# Patient Record
Sex: Female | Born: 1965 | Race: Black or African American | Hispanic: No | Marital: Single | Smoking: Never smoker
Health system: Southern US, Community
[De-identification: ages and names within clinical notes are randomized; demographics above are authoritative.]

## PROBLEM LIST (undated history)

## (undated) DIAGNOSIS — J189 Pneumonia, unspecified organism: Secondary | ICD-10-CM

## (undated) DIAGNOSIS — M779 Enthesopathy, unspecified: Secondary | ICD-10-CM

## (undated) DIAGNOSIS — J40 Bronchitis, not specified as acute or chronic: Secondary | ICD-10-CM

## (undated) DIAGNOSIS — J302 Other seasonal allergic rhinitis: Secondary | ICD-10-CM

## (undated) DIAGNOSIS — M1990 Unspecified osteoarthritis, unspecified site: Secondary | ICD-10-CM

## (undated) DIAGNOSIS — D649 Anemia, unspecified: Secondary | ICD-10-CM

## (undated) DIAGNOSIS — R011 Cardiac murmur, unspecified: Secondary | ICD-10-CM

## (undated) DIAGNOSIS — H6093 Unspecified otitis externa, bilateral: Secondary | ICD-10-CM

---

## 1991-06-04 HISTORY — PX: OTHER SURGICAL HISTORY: SHX169

## 2003-12-14 ENCOUNTER — Other Ambulatory Visit: Admission: RE | Admit: 2003-12-14 | Discharge: 2003-12-14 | Payer: Self-pay | Admitting: Family Medicine

## 2003-12-18 ENCOUNTER — Ambulatory Visit (HOSPITAL_COMMUNITY): Admission: RE | Admit: 2003-12-18 | Discharge: 2003-12-18 | Payer: Self-pay | Admitting: Family Medicine

## 2005-06-18 ENCOUNTER — Other Ambulatory Visit: Admission: RE | Admit: 2005-06-18 | Discharge: 2005-06-18 | Payer: Self-pay | Admitting: Family Medicine

## 2005-06-20 ENCOUNTER — Emergency Department (HOSPITAL_COMMUNITY): Admission: EM | Admit: 2005-06-20 | Discharge: 2005-06-20 | Payer: Self-pay | Admitting: Emergency Medicine

## 2008-08-10 ENCOUNTER — Other Ambulatory Visit: Admission: RE | Admit: 2008-08-10 | Discharge: 2008-08-10 | Payer: Self-pay | Admitting: Family Medicine

## 2010-02-08 ENCOUNTER — Emergency Department: Payer: Self-pay | Admitting: Emergency Medicine

## 2010-04-11 ENCOUNTER — Other Ambulatory Visit: Admission: RE | Admit: 2010-04-11 | Discharge: 2010-04-11 | Payer: Self-pay | Admitting: Family Medicine

## 2010-08-17 ENCOUNTER — Other Ambulatory Visit: Payer: Self-pay | Admitting: Family Medicine

## 2010-08-17 DIAGNOSIS — Z1231 Encounter for screening mammogram for malignant neoplasm of breast: Secondary | ICD-10-CM

## 2010-08-29 ENCOUNTER — Ambulatory Visit
Admission: RE | Admit: 2010-08-29 | Discharge: 2010-08-29 | Disposition: A | Discharge status: Home / Self Care | Payer: BC Managed Care – PPO | Source: Ambulatory Visit | Attending: Family Medicine | Admitting: Family Medicine

## 2010-08-29 DIAGNOSIS — Z1231 Encounter for screening mammogram for malignant neoplasm of breast: Secondary | ICD-10-CM

## 2011-11-14 ENCOUNTER — Other Ambulatory Visit: Payer: Self-pay | Admitting: Family Medicine

## 2011-11-14 ENCOUNTER — Ambulatory Visit
Admission: RE | Admit: 2011-11-14 | Discharge: 2011-11-14 | Disposition: A | Discharge status: Home / Self Care | Payer: BC Managed Care – PPO | Source: Ambulatory Visit | Attending: Family Medicine | Admitting: Family Medicine

## 2011-11-14 DIAGNOSIS — R509 Fever, unspecified: Secondary | ICD-10-CM

## 2011-11-14 DIAGNOSIS — R112 Nausea with vomiting, unspecified: Secondary | ICD-10-CM

## 2011-11-14 DIAGNOSIS — R109 Unspecified abdominal pain: Secondary | ICD-10-CM

## 2011-11-14 MED ORDER — IOHEXOL 300 MG/ML  SOLN
100.0000 mL | Freq: Once | INTRAMUSCULAR | Status: AC | PRN
  Administered 2011-11-14: 100 mL via INTRAVENOUS

## 2012-12-06 IMAGING — CT CT ABD-PELV W/ CM
2 of 5 series · 17 of 46 positions shown, 19 images · IV contrast ([ID] [id number])
Comparison: None.

CLINICAL DATA: Mid abdominal pain for 2 days, fever, nausea - - -
the patient could not tolerate oral contrast and therefore the
study was done with IV contrast only.

CT ABDOMEN AND PELVIS WITH CONTRAST
TECHNIQUE: Multidetector CT imaging of the abdomen and pelvis was
performed following the standard protocol during bolus
administration of intravenous contrast.
Contrast:  100 ml Zmnipaque-2II

[Series 2: abd/pelvis with · axial · 0.76mm/px · z∈[-279,+51]mm · 14 of 74 slices shown, 16 images]
[im 4/74  soft-tissue]
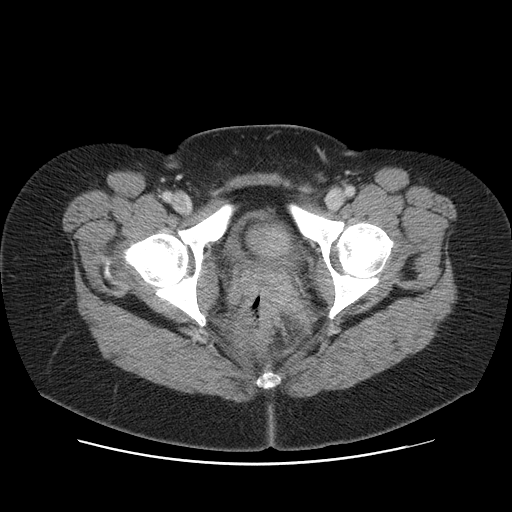
[im 4/74  bone]
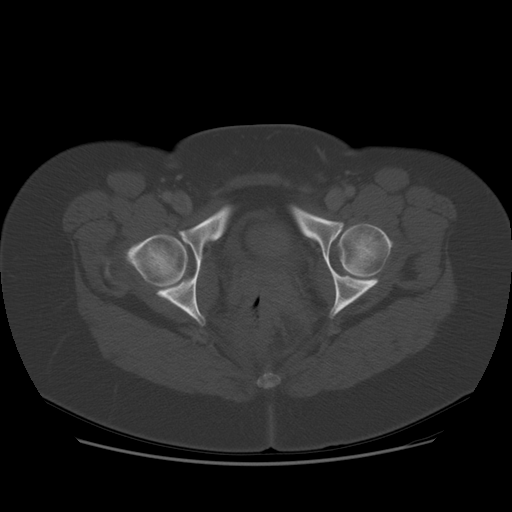
[im 8/74  soft-tissue]
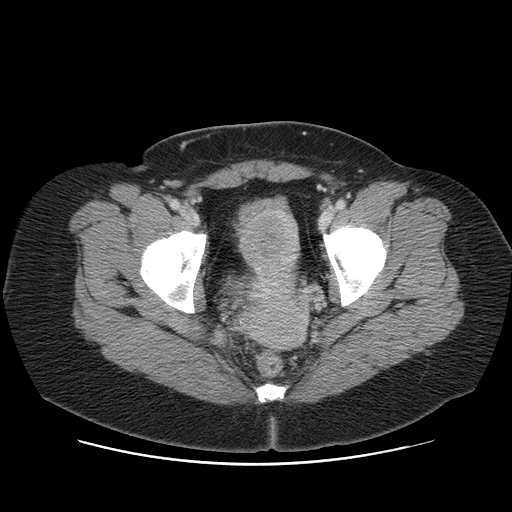
[im 16/74  soft-tissue]
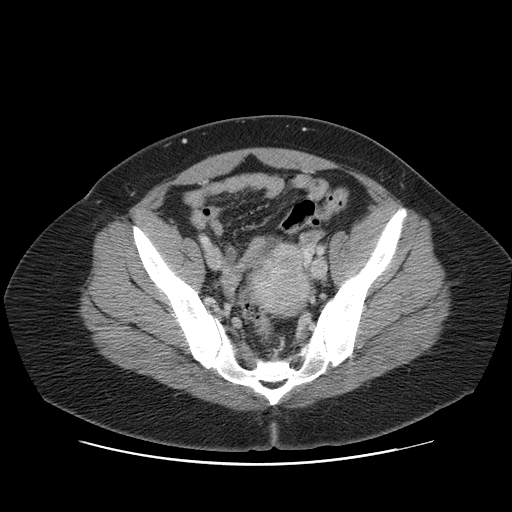
[im 20/74  soft-tissue]
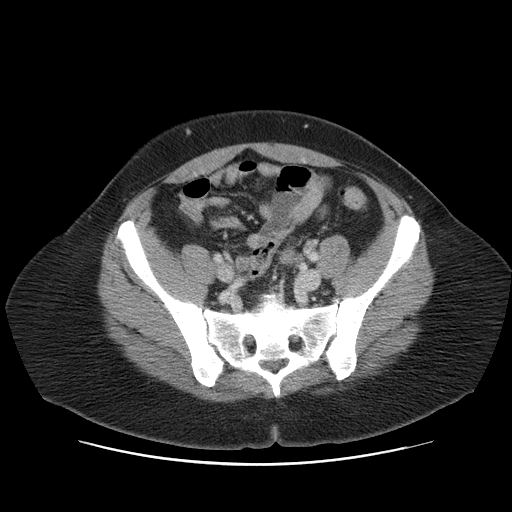
[im 24/74  soft-tissue]
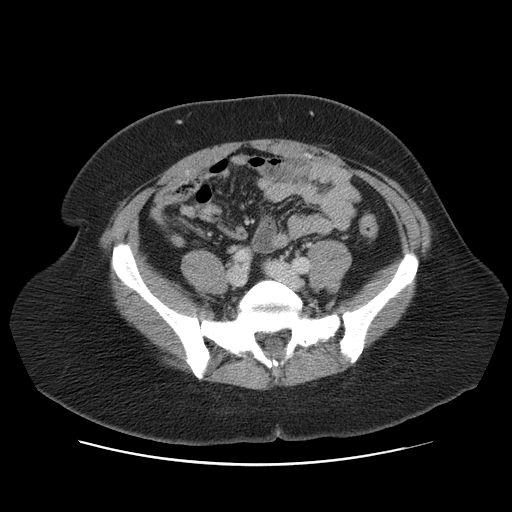
[im 31/74  soft-tissue]
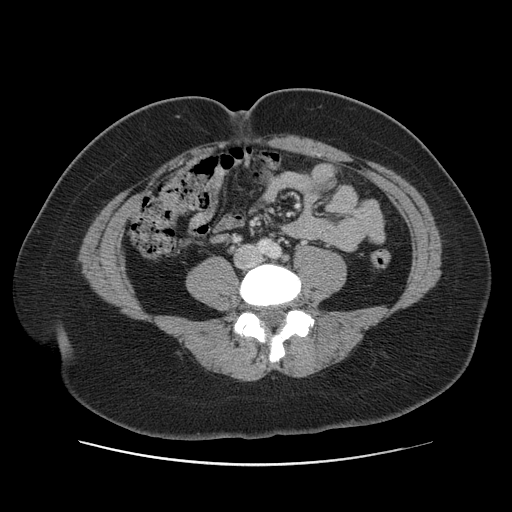
[im 35/74  soft-tissue]
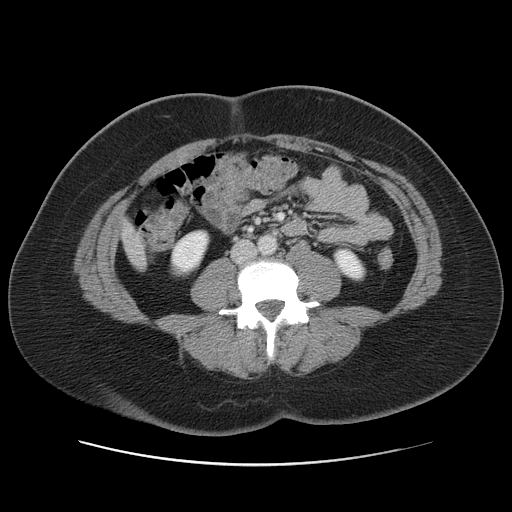
[im 39/74  soft-tissue]
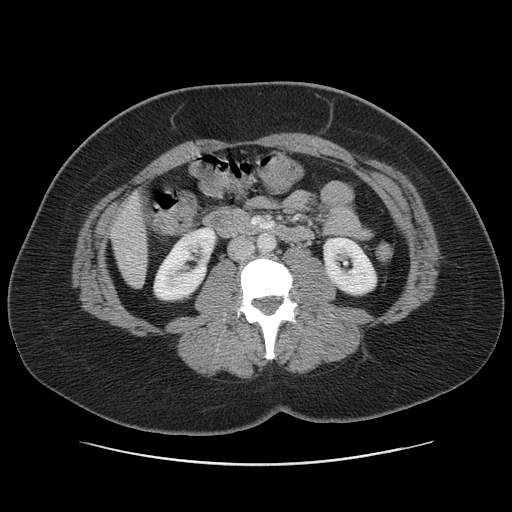
[im 43/74  soft-tissue]
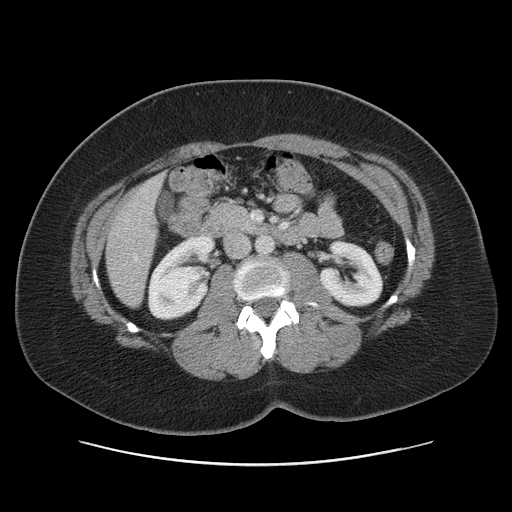
[im 43/74  bone]
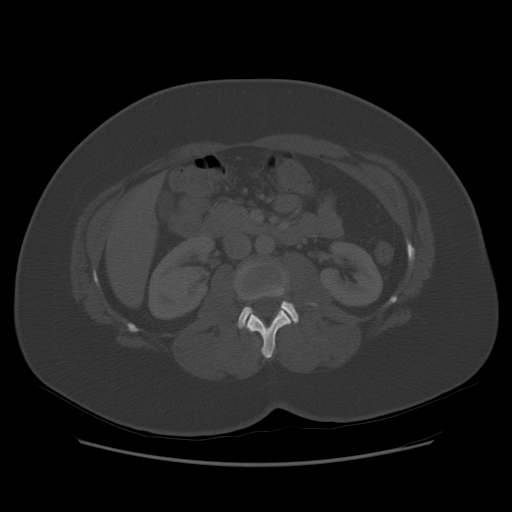
[im 50/74  soft-tissue]
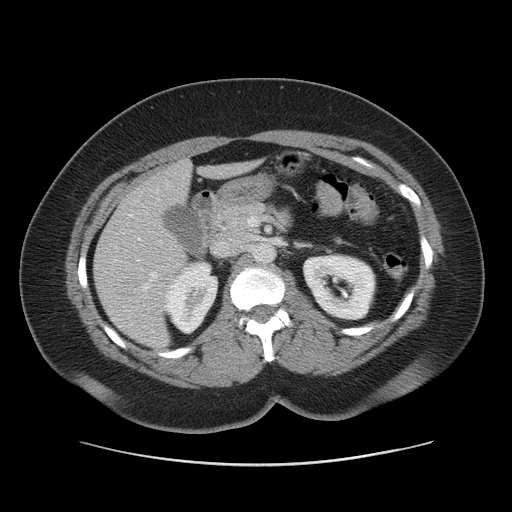
[im 54/74  soft-tissue]
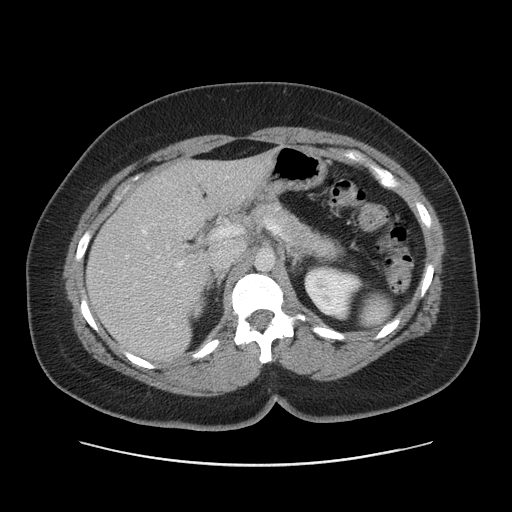
[im 58/74  soft-tissue]
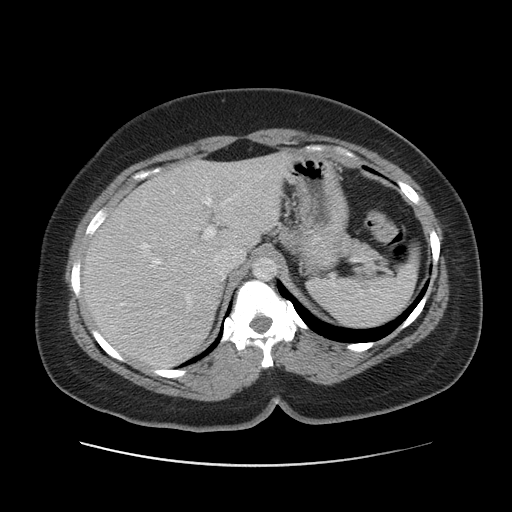
[im 66/74  soft-tissue]
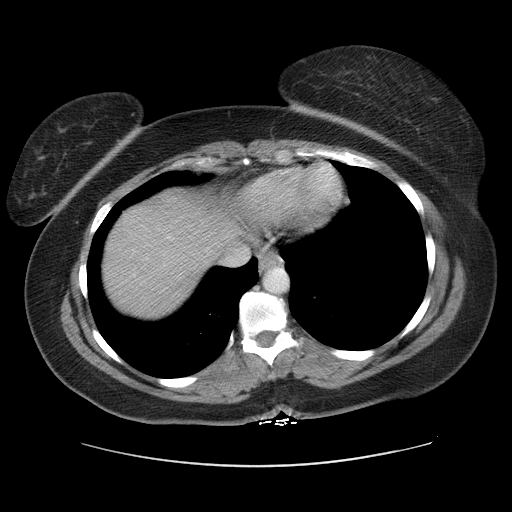
[im 70/74  soft-tissue]
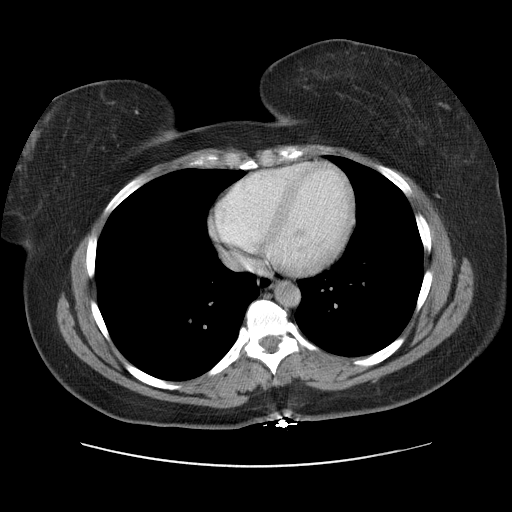

[Series 400: coronal · coronal · 0.87mm/px · 3 of 120 slices shown]
[im 40/120  soft-tissue]
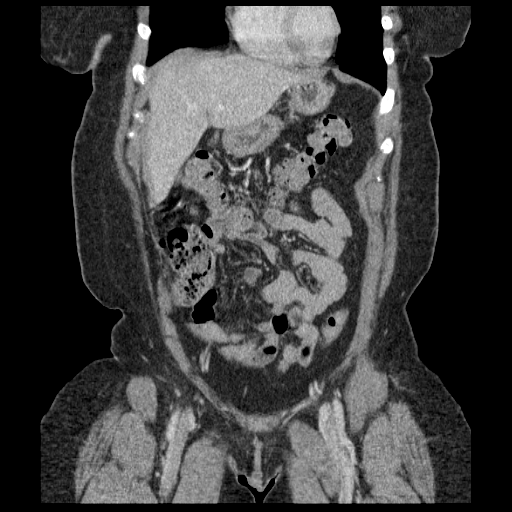
[im 53/120  soft-tissue]
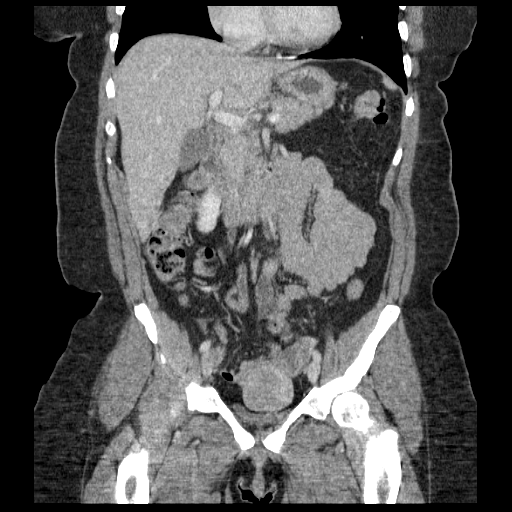
[im 67/120  soft-tissue]
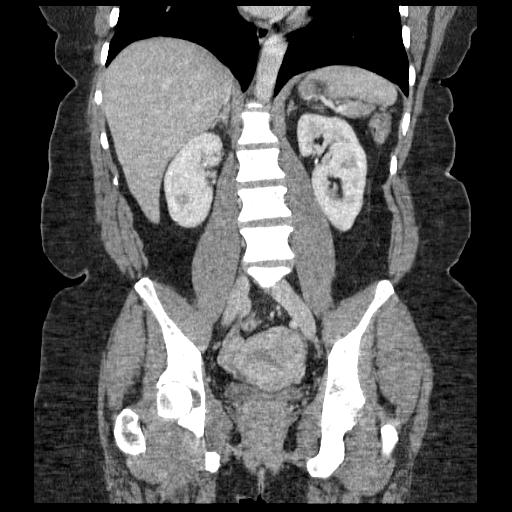

[17 of 46 positions shown; findings below may reference images not displayed]

FINDINGS: The lung bases are clear.  The liver enhances with no
focal abnormality and no ductal dilatation is seen.  A tiny low
attenuation structure in the inferior aspect of the right lobe
medially is most consistent with incidental cyst, persisting on
delayed images.  No calcified gallstones are seen.  The pancreas is
normal in size and the pancreatic duct is not dilated.  The adrenal
glands and spleen are unremarkable.  The stomach is not well
distended.  The kidneys enhance with no calculus or mass and no
hydronephrosis is seen.  The abdominal aorta is normal in caliber.
The mesenteric vasculature appears patent.  No adenopathy is seen.

The appendix is moderately well seen within the right pelvis and
measures only 6 mm in diameter.  There is no definite evidence
presently of acute appendicitis.  There are at least two or three
uterine fibroids present.  The largest of these emanates from the
fundus anteriorly measuring 5.0 x 4.6 cm.  Ovarian follicles are
noted.  No free fluid is seen within the pelvis.  The urinary
bladder is decompressed.  No abnormality of the colon is seen.  The
terminal ileum is unremarkable.  No bony abnormality is seen.
IMPRESSION: 1.  No CT evidence of acute appendicitis is seen.  The appendix is
normal in size and no periappendiceal strandiness is seen.
2.  Multiple uterine fibroids, the largest of 5 cm in diameter.

## 2013-10-15 ENCOUNTER — Other Ambulatory Visit (HOSPITAL_COMMUNITY)
Admission: RE | Admit: 2013-10-15 | Discharge: 2013-10-15 | Disposition: A | Discharge status: Home / Self Care | Payer: BC Managed Care – PPO | Source: Ambulatory Visit | Attending: Family Medicine | Admitting: Family Medicine

## 2013-10-15 ENCOUNTER — Other Ambulatory Visit: Payer: Self-pay | Admitting: Family Medicine

## 2013-10-15 DIAGNOSIS — Z124 Encounter for screening for malignant neoplasm of cervix: Secondary | ICD-10-CM | POA: Insufficient documentation

## 2013-10-15 DIAGNOSIS — Z1151 Encounter for screening for human papillomavirus (HPV): Secondary | ICD-10-CM | POA: Insufficient documentation

## 2013-10-19 LAB — CYTOLOGY - PAP

## 2015-05-10 ENCOUNTER — Other Ambulatory Visit: Payer: Self-pay | Admitting: Obstetrics & Gynecology

## 2015-08-28 ENCOUNTER — Other Ambulatory Visit (HOSPITAL_COMMUNITY): Payer: Self-pay | Admitting: Obstetrics and Gynecology

## 2015-08-31 NOTE — Patient Instructions (Signed)
Your procedure is scheduled on:  Wednesday, September 13, 2015  Enter through the [employer] [facility] at:  9:00 AM  Pick up the phone at the desk and dial [phone].  Call this number if you have problems the morning of surgery: [phone].  Remember: Do NOT eat food or drink after:  Midnight Tuesday, September 12, 2015  Take these medicines the morning of surgery with a SIP OF WATER: None  Bring Asthma inhaler day of surgery.  Do NOT wear jewelry (body piercing), metal hair clips/bobby pins, make-up, or nail polish. Do NOT wear lotions, powders, or perfumes.  You may wear deodorant. Do NOT shave for 48 hours prior to surgery. Do NOT bring valuables to the hospital. Contacts, dentures, or bridgework may not be worn into surgery.  Leave suitcase in car.  After surgery it may be brought to your room.  For patients admitted to the hospital, checkout time is 11:00 AM the day of discharge.

## 2015-09-01 ENCOUNTER — Encounter (HOSPITAL_COMMUNITY)
Admission: RE | Admit: 2015-09-01 | Discharge: 2015-09-01 | Disposition: A | Discharge status: Home / Self Care | Payer: BLUE CROSS/BLUE SHIELD | Source: Ambulatory Visit | Attending: Obstetrics and Gynecology | Admitting: Obstetrics and Gynecology

## 2015-09-01 ENCOUNTER — Encounter (HOSPITAL_COMMUNITY): Payer: Self-pay

## 2015-09-01 DIAGNOSIS — Z01812 Encounter for preprocedural laboratory examination: Secondary | ICD-10-CM | POA: Insufficient documentation

## 2015-09-01 HISTORY — DX: Bronchitis, not specified as acute or chronic: J40

## 2015-09-01 HISTORY — DX: Other seasonal allergic rhinitis: J30.2

## 2015-09-01 HISTORY — DX: Anemia, unspecified: D64.9

## 2015-09-01 HISTORY — DX: Cardiac murmur, unspecified: R01.1

## 2015-09-01 HISTORY — DX: Unspecified osteoarthritis, unspecified site: M19.90

## 2015-09-01 HISTORY — DX: Pneumonia, unspecified organism: J18.9

## 2015-09-01 HISTORY — DX: Unspecified otitis externa, bilateral: H60.93

## 2015-09-01 HISTORY — DX: Enthesopathy, unspecified: M77.9

## 2015-09-01 LAB — CBC
HEMATOCRIT: 32.3 % — AB (ref 36.0–46.0)
Hemoglobin: 9.8 g/dL — ABNORMAL LOW (ref 12.0–15.0)
MCH: 17.8 pg — ABNORMAL LOW (ref 26.0–34.0)
MCHC: 30.3 g/dL (ref 30.0–36.0)
MCV: 58.6 fL — AB (ref 78.0–100.0)
PLATELETS: 323 10*3/uL (ref 150–400)
RBC: 5.51 MIL/uL — ABNORMAL HIGH (ref 3.87–5.11)
RDW: 25.8 % — AB (ref 11.5–15.5)
WBC: 5.7 10*3/uL (ref 4.0–10.5)

## 2015-09-01 LAB — BASIC METABOLIC PANEL
Anion gap: 8 (ref 5–15)
BUN: 11 mg/dL (ref 6–20)
CO2: 25 mmol/L (ref 22–32)
CREATININE: 0.71 mg/dL (ref 0.44–1.00)
Calcium: 8.7 mg/dL — ABNORMAL LOW (ref 8.9–10.3)
Chloride: 101 mmol/L (ref 101–111)
GFR calc Af Amer: >60 mL/min (ref >60)
GLUCOSE: 96 mg/dL (ref 65–99)
POTASSIUM: 4 mmol/L (ref 3.5–5.1)
Sodium: 134 mmol/L — ABNORMAL LOW (ref 135–145)

## 2015-09-01 LAB — TYPE AND SCREEN
ABO/RH(D): A POS
ANTIBODY SCREEN: NEGATIVE

## 2015-09-01 LAB — ABO/RH: ABO/RH(D): A POS

## 2015-09-12 MED ORDER — DEXTROSE 5 % IV SOLN
2.0000 g | INTRAVENOUS | Status: AC
Start: 2015-09-13 — End: 2015-09-13
  Administered 2015-09-13: 2 g via INTRAVENOUS
  Filled 2015-09-12: qty 2

## 2015-09-13 ENCOUNTER — Observation Stay (HOSPITAL_COMMUNITY)
Admission: RE | Admit: 2015-09-13 | Discharge: 2015-09-14 | Disposition: A | Discharge status: Home / Self Care | Payer: BLUE CROSS/BLUE SHIELD | Source: Ambulatory Visit | Attending: Obstetrics and Gynecology | Admitting: Obstetrics and Gynecology

## 2015-09-13 ENCOUNTER — Ambulatory Visit (HOSPITAL_COMMUNITY): Payer: BLUE CROSS/BLUE SHIELD | Admitting: Anesthesiology

## 2015-09-13 ENCOUNTER — Encounter (HOSPITAL_COMMUNITY): Payer: Self-pay

## 2015-09-13 ENCOUNTER — Encounter (HOSPITAL_COMMUNITY)
Admission: RE | Disposition: A | Discharge status: Home / Self Care | Payer: Self-pay | Source: Ambulatory Visit | Attending: Obstetrics and Gynecology

## 2015-09-13 ENCOUNTER — Other Ambulatory Visit (HOSPITAL_COMMUNITY): Payer: Self-pay | Admitting: Obstetrics and Gynecology

## 2015-09-13 DIAGNOSIS — Z90710 Acquired absence of both cervix and uterus: Secondary | ICD-10-CM | POA: Diagnosis present

## 2015-09-13 DIAGNOSIS — D252 Subserosal leiomyoma of uterus: Secondary | ICD-10-CM | POA: Diagnosis not present

## 2015-09-13 DIAGNOSIS — N9972 Accidental puncture and laceration of a genitourinary system organ or structure during other procedure: Secondary | ICD-10-CM | POA: Insufficient documentation

## 2015-09-13 DIAGNOSIS — D251 Intramural leiomyoma of uterus: Secondary | ICD-10-CM | POA: Insufficient documentation

## 2015-09-13 DIAGNOSIS — N920 Excessive and frequent menstruation with regular cycle: Secondary | ICD-10-CM | POA: Diagnosis not present

## 2015-09-13 DIAGNOSIS — D500 Iron deficiency anemia secondary to blood loss (chronic): Secondary | ICD-10-CM | POA: Diagnosis not present

## 2015-09-13 HISTORY — PX: ROBOTIC ASSISTED TOTAL HYSTERECTOMY WITH SALPINGECTOMY: SHX6679

## 2015-09-13 LAB — PREGNANCY, URINE: PREG TEST UR: NEGATIVE

## 2015-09-13 SURGERY — ROBOTIC ASSISTED TOTAL HYSTERECTOMY WITH SALPINGECTOMY
Anesthesia: General | Site: Abdomen | Laterality: Bilateral

## 2015-09-13 MED ORDER — SUGAMMADEX SODIUM 200 MG/2ML IV SOLN
INTRAVENOUS | Status: DC | PRN
Start: 2015-09-13 — End: 2015-09-13
  Administered 2015-09-13: 160 mg via INTRAVENOUS

## 2015-09-13 MED ORDER — IBUPROFEN 800 MG PO TABS: 800.0000 mg | ORAL_TABLET | Freq: Three times a day (TID) | ORAL | Status: DC | PRN

## 2015-09-13 MED ORDER — PHENYLEPHRINE 40 MCG/ML (10ML) SYRINGE FOR IV PUSH (FOR BLOOD PRESSURE SUPPORT)
PREFILLED_SYRINGE | INTRAVENOUS | Status: AC
  Filled 2015-09-13: qty 10

## 2015-09-13 MED ORDER — SIMETHICONE 80 MG PO CHEW: 80.0000 mg | CHEWABLE_TABLET | Freq: Four times a day (QID) | ORAL | Status: DC | PRN

## 2015-09-13 MED ORDER — KETOROLAC TROMETHAMINE 30 MG/ML IJ SOLN
30.0000 mg | Freq: Four times a day (QID) | INTRAMUSCULAR | Status: AC
  Administered 2015-09-13 – 2015-09-14 (×3): 30 mg via INTRAVENOUS
  Filled 2015-09-13 (×3): qty 1

## 2015-09-13 MED ORDER — SUGAMMADEX SODIUM 200 MG/2ML IV SOLN
INTRAVENOUS | Status: AC
  Filled 2015-09-13: qty 2

## 2015-09-13 MED ORDER — OXYCODONE HCL 5 MG PO TABS: 5.0000 mg | ORAL_TABLET | Freq: Once | ORAL | Status: DC | PRN

## 2015-09-13 MED ORDER — PROPOFOL 10 MG/ML IV BOLUS
INTRAVENOUS | Status: AC
  Filled 2015-09-13: qty 20

## 2015-09-13 MED ORDER — SCOPOLAMINE 1 MG/3DAYS TD PT72
1.0000 | MEDICATED_PATCH | Freq: Once | TRANSDERMAL | Status: DC
Start: 2015-09-13 — End: 2015-09-13
  Administered 2015-09-13: 1.5 mg via TRANSDERMAL

## 2015-09-13 MED ORDER — ROPIVACAINE HCL 5 MG/ML IJ SOLN
INTRAMUSCULAR | Status: AC
  Filled 2015-09-13: qty 30

## 2015-09-13 MED ORDER — LACTATED RINGERS IV SOLN
INTRAVENOUS | Status: DC
  Administered 2015-09-13: 125 mL/h via INTRAVENOUS

## 2015-09-13 MED ORDER — MIDAZOLAM HCL 2 MG/2ML IJ SOLN
INTRAMUSCULAR | Status: DC | PRN
  Administered 2015-09-13: 2 mg via INTRAVENOUS

## 2015-09-13 MED ORDER — MEPERIDINE HCL 25 MG/ML IJ SOLN: 6.2500 mg | INTRAMUSCULAR | Status: DC | PRN

## 2015-09-13 MED ORDER — PHENYLEPHRINE HCL 10 MG/ML IJ SOLN
INTRAMUSCULAR | Status: DC | PRN
  Administered 2015-09-13: .08 mg via INTRAVENOUS
  Administered 2015-09-13 (×2): .04 mg via INTRAVENOUS

## 2015-09-13 MED ORDER — KETOROLAC TROMETHAMINE 30 MG/ML IJ SOLN: 30.0000 mg | Freq: Four times a day (QID) | INTRAMUSCULAR | Status: AC

## 2015-09-13 MED ORDER — ONDANSETRON HCL 4 MG PO TABS: 4.0000 mg | ORAL_TABLET | Freq: Four times a day (QID) | ORAL | Status: DC | PRN

## 2015-09-13 MED ORDER — METHYLENE BLUE 1 % INJ SOLN
INTRAMUSCULAR | Status: AC
  Filled 2015-09-13: qty 1

## 2015-09-13 MED ORDER — ONDANSETRON HCL 4 MG/2ML IJ SOLN: 4.0000 mg | Freq: Once | INTRAMUSCULAR | Status: DC | PRN

## 2015-09-13 MED ORDER — ESTRADIOL 0.1 MG/GM VA CREA
TOPICAL_CREAM | VAGINAL | Status: DC | PRN
  Administered 2015-09-13 (×2): 1 via VAGINAL

## 2015-09-13 MED ORDER — ZOLPIDEM TARTRATE 5 MG PO TABS: 5.0000 mg | ORAL_TABLET | Freq: Every evening | ORAL | Status: DC | PRN

## 2015-09-13 MED ORDER — LACTATED RINGERS IR SOLN
Status: DC | PRN
  Administered 2015-09-13: 3000 mL

## 2015-09-13 MED ORDER — ROCURONIUM BROMIDE 100 MG/10ML IV SOLN
INTRAVENOUS | Status: AC
  Filled 2015-09-13: qty 1

## 2015-09-13 MED ORDER — SODIUM CHLORIDE 0.9 % IV SOLN
INTRAVENOUS | Status: DC | PRN
  Administered 2015-09-13: 10 mL
  Administered 2015-09-13: 40 mL
  Administered 2015-09-13: 5 mL
  Administered 2015-09-13: 60 mL

## 2015-09-13 MED ORDER — PANTOPRAZOLE SODIUM 40 MG PO TBEC: 40.0000 mg | DELAYED_RELEASE_TABLET | Freq: Every day | ORAL | Status: DC

## 2015-09-13 MED ORDER — KETOROLAC TROMETHAMINE 30 MG/ML IJ SOLN
INTRAMUSCULAR | Status: AC
  Filled 2015-09-13: qty 1

## 2015-09-13 MED ORDER — ROCURONIUM BROMIDE 100 MG/10ML IV SOLN
INTRAVENOUS | Status: DC | PRN
  Administered 2015-09-13: 50 mg via INTRAVENOUS
  Administered 2015-09-13 (×2): 20 mg via INTRAVENOUS
  Administered 2015-09-13: 10 mg via INTRAVENOUS

## 2015-09-13 MED ORDER — KETOROLAC TROMETHAMINE 30 MG/ML IJ SOLN
INTRAMUSCULAR | Status: DC | PRN
  Administered 2015-09-13: 30 mg via INTRAVENOUS

## 2015-09-13 MED ORDER — LIDOCAINE HCL (CARDIAC) 20 MG/ML IV SOLN
INTRAVENOUS | Status: AC
  Filled 2015-09-13: qty 5

## 2015-09-13 MED ORDER — DEXAMETHASONE SODIUM PHOSPHATE 4 MG/ML IJ SOLN
INTRAMUSCULAR | Status: AC
  Filled 2015-09-13: qty 1

## 2015-09-13 MED ORDER — HYDROMORPHONE HCL 1 MG/ML IJ SOLN: 0.2500 mg | INTRAMUSCULAR | Status: DC | PRN

## 2015-09-13 MED ORDER — SODIUM CHLORIDE 0.9 % IJ SOLN
INTRAMUSCULAR | Status: AC
Start: 2015-09-13 — End: 2015-09-13
  Filled 2015-09-13: qty 50

## 2015-09-13 MED ORDER — OXYCODONE HCL 5 MG/5ML PO SOLN: 5.0000 mg | Freq: Once | ORAL | Status: DC | PRN

## 2015-09-13 MED ORDER — FENTANYL CITRATE (PF) 250 MCG/5ML IJ SOLN
INTRAMUSCULAR | Status: AC
  Filled 2015-09-13: qty 5

## 2015-09-13 MED ORDER — SCOPOLAMINE 1 MG/3DAYS TD PT72
MEDICATED_PATCH | TRANSDERMAL | Status: AC
  Administered 2015-09-13: 1.5 mg via TRANSDERMAL
  Filled 2015-09-13: qty 1

## 2015-09-13 MED ORDER — ONDANSETRON HCL 4 MG/2ML IJ SOLN
INTRAMUSCULAR | Status: DC | PRN
Start: 2015-09-13 — End: 2015-09-13
  Administered 2015-09-13: 4 mg via INTRAVENOUS

## 2015-09-13 MED ORDER — ONDANSETRON HCL 4 MG/2ML IJ SOLN: 4.0000 mg | Freq: Four times a day (QID) | INTRAMUSCULAR | Status: DC | PRN

## 2015-09-13 MED ORDER — MIDAZOLAM HCL 2 MG/2ML IJ SOLN
INTRAMUSCULAR | Status: AC
  Filled 2015-09-13: qty 2

## 2015-09-13 MED ORDER — PROPOFOL 10 MG/ML IV BOLUS
INTRAVENOUS | Status: DC | PRN
  Administered 2015-09-13: 200 mg via INTRAVENOUS

## 2015-09-13 MED ORDER — LACTATED RINGERS IV SOLN
INTRAVENOUS | Status: DC
  Administered 2015-09-13 – 2015-09-14 (×2): via INTRAVENOUS

## 2015-09-13 MED ORDER — HYDROMORPHONE HCL 1 MG/ML IJ SOLN
0.2000 mg | INTRAMUSCULAR | Status: DC | PRN
  Administered 2015-09-13: 0.6 mg via INTRAVENOUS
  Filled 2015-09-13: qty 1

## 2015-09-13 MED ORDER — METHYLENE BLUE 0.5 % INJ SOLN
INTRAVENOUS | Status: DC | PRN
  Administered 2015-09-13: 1 mL

## 2015-09-13 MED ORDER — HYDROMORPHONE HCL 1 MG/ML IJ SOLN
INTRAMUSCULAR | Status: AC
  Filled 2015-09-13: qty 1

## 2015-09-13 MED ORDER — HYDROMORPHONE HCL 1 MG/ML IJ SOLN
INTRAMUSCULAR | Status: DC | PRN
  Administered 2015-09-13: 1 mg via INTRAVENOUS

## 2015-09-13 MED ORDER — ALBUTEROL SULFATE (2.5 MG/3ML) 0.083% IN NEBU: 3.0000 mL | INHALATION_SOLUTION | Freq: Four times a day (QID) | RESPIRATORY_TRACT | Status: DC | PRN

## 2015-09-13 MED ORDER — ONDANSETRON HCL 4 MG/2ML IJ SOLN
INTRAMUSCULAR | Status: AC
  Filled 2015-09-13: qty 2

## 2015-09-13 MED ORDER — OXYCODONE-ACETAMINOPHEN 5-325 MG PO TABS
1.0000 | ORAL_TABLET | ORAL | Status: DC | PRN
  Administered 2015-09-14: 1 via ORAL
  Filled 2015-09-13: qty 1

## 2015-09-13 MED ORDER — ARTIFICIAL TEARS OP OINT
TOPICAL_OINTMENT | OPHTHALMIC | Status: AC
  Filled 2015-09-13: qty 3.5

## 2015-09-13 MED ORDER — GLYCOPYRROLATE 0.2 MG/ML IJ SOLN
INTRAMUSCULAR | Status: DC | PRN
  Administered 2015-09-13: 0.1 mg via INTRAVENOUS
  Administered 2015-09-13: 0.2 mg via INTRAVENOUS

## 2015-09-13 MED ORDER — SENNA 8.6 MG PO TABS
1.0000 | ORAL_TABLET | Freq: Two times a day (BID) | ORAL | Status: DC
  Administered 2015-09-13: 8.6 mg via ORAL
  Filled 2015-09-13 (×2): qty 1

## 2015-09-13 MED ORDER — MENTHOL 3 MG MT LOZG: 1.0000 | LOZENGE | OROMUCOSAL | Status: DC | PRN

## 2015-09-13 MED ORDER — SODIUM CHLORIDE 0.9 % IJ SOLN
INTRAMUSCULAR | Status: AC
  Filled 2015-09-13: qty 10

## 2015-09-13 MED ORDER — STERILE WATER FOR IRRIGATION IR SOLN
Status: DC | PRN
  Administered 2015-09-13: 200 mL via INTRAVESICAL

## 2015-09-13 MED ORDER — GLYCOPYRROLATE 0.2 MG/ML IJ SOLN
INTRAMUSCULAR | Status: AC
  Filled 2015-09-13: qty 1

## 2015-09-13 MED ORDER — ESTRADIOL 0.1 MG/GM VA CREA
TOPICAL_CREAM | VAGINAL | Status: AC
  Filled 2015-09-13: qty 42.5

## 2015-09-13 MED ORDER — DEXAMETHASONE SODIUM PHOSPHATE 10 MG/ML IJ SOLN
INTRAMUSCULAR | Status: DC | PRN
  Administered 2015-09-13: 4 mg via INTRAVENOUS

## 2015-09-13 MED ORDER — FENTANYL CITRATE (PF) 100 MCG/2ML IJ SOLN
INTRAMUSCULAR | Status: DC | PRN
  Administered 2015-09-13: 150 ug via INTRAVENOUS
  Administered 2015-09-13: 100 ug via INTRAVENOUS
  Administered 2015-09-13: 50 ug via INTRAVENOUS
  Administered 2015-09-13 (×2): 100 ug via INTRAVENOUS

## 2015-09-13 MED ORDER — LIDOCAINE HCL (CARDIAC) 20 MG/ML IV SOLN
INTRAVENOUS | Status: DC | PRN
  Administered 2015-09-13: 50 mg via INTRAVENOUS

## 2015-09-13 MED ORDER — ALUM & MAG HYDROXIDE-SIMETH 200-200-20 MG/5ML PO SUSP
30.0000 mL | ORAL | Status: DC | PRN
Start: 2015-09-13 — End: 2015-09-14

## 2015-09-13 SURGICAL SUPPLY — 59 items
BARRIER ADHS 3X4 INTERCEED (GAUZE/BANDAGES/DRESSINGS) ×2 IMPLANT
BRR ADH 4X3 ABS CNTRL BYND (GAUZE/BANDAGES/DRESSINGS) ×1
CATH FOLEY 3WAY  5CC 16FR (CATHETERS) ×1
CATH FOLEY 3WAY 5CC 16FR (CATHETERS) ×1 IMPLANT
CONT PATH 16OZ SNAP LID 3702 (MISCELLANEOUS) ×2 IMPLANT
COVER BACK TABLE 60X90IN (DRAPES) ×4 IMPLANT
COVER TIP SHEARS 8 DVNC (MISCELLANEOUS) ×1 IMPLANT
COVER TIP SHEARS 8MM DA VINCI (MISCELLANEOUS) ×1
DECANTER SPIKE VIAL GLASS SM (MISCELLANEOUS) ×2 IMPLANT
DILATOR CANAL MILEX (MISCELLANEOUS) ×1 IMPLANT
DRSG OPSITE POSTOP 3X4 (GAUZE/BANDAGES/DRESSINGS) ×1 IMPLANT
DURAPREP 26ML APPLICATOR (WOUND CARE) ×2 IMPLANT
ELECT REM PT RETURN 9FT ADLT (ELECTROSURGICAL) ×2
ELECTRODE REM PT RTRN 9FT ADLT (ELECTROSURGICAL) ×1 IMPLANT
GAUZE PACKING 1/2X5YD (GAUZE/BANDAGES/DRESSINGS) ×1 IMPLANT
GAUZE VASELINE 3X9 (GAUZE/BANDAGES/DRESSINGS) IMPLANT
GLOVE BIO SURGEON STRL SZ7 (GLOVE) IMPLANT
GLOVE BIOGEL M 6.5 STRL (GLOVE) ×6 IMPLANT
GLOVE BIOGEL PI IND STRL 6.5 (GLOVE) ×1 IMPLANT
GLOVE BIOGEL PI IND STRL 7.0 (GLOVE) ×6 IMPLANT
GLOVE BIOGEL PI INDICATOR 6.5 (GLOVE) ×1
GLOVE BIOGEL PI INDICATOR 7.0 (GLOVE) ×6
GLOVE INDICATOR 7.0 STRL GRN (GLOVE) ×1 IMPLANT
GLOVE SURG SS PI 7.0 STRL IVOR (GLOVE) ×2 IMPLANT
HEMOSTAT ARISTA ABSORB 3G PWDR (MISCELLANEOUS) ×1 IMPLANT
KIT ACCESSORY DA VINCI DISP (KITS) ×1
KIT ACCESSORY DVNC DISP (KITS) ×1 IMPLANT
LEGGING LITHOTOMY PAIR STRL (DRAPES) ×3 IMPLANT
LIQUID BAND (GAUZE/BANDAGES/DRESSINGS) ×2 IMPLANT
OCCLUDER COLPOPNEUMO (BALLOONS) ×1 IMPLANT
PACK ROBOT WH (CUSTOM PROCEDURE TRAY) ×2 IMPLANT
PACK ROBOTIC GOWN (GOWN DISPOSABLE) ×2 IMPLANT
PAD PREP 24X48 CUFFED NSTRL (MISCELLANEOUS) ×6 IMPLANT
PAD TRENDELENBURG POSITION (MISCELLANEOUS) ×2 IMPLANT
SET CYSTO W/LG BORE CLAMP LF (SET/KITS/TRAYS/PACK) ×2 IMPLANT
SET IRRIG TUBING LAPAROSCOPIC (IRRIGATION / IRRIGATOR) ×2 IMPLANT
SET TRI-LUMEN FLTR TB AIRSEAL (TUBING) ×1 IMPLANT
SLEEVE XCEL OPT CAN 5 100 (ENDOMECHANICALS) ×2 IMPLANT
SUT VIC AB 0 CT1 27 (SUTURE) ×4
SUT VIC AB 0 CT1 27XBRD ANBCTR (SUTURE) ×2 IMPLANT
SUT VIC AB 2-0 SH 27 (SUTURE) ×2
SUT VIC AB 2-0 SH 27XBRD (SUTURE) IMPLANT
SUT VICRYL 0 27 CT2 27 ABS (SUTURE) ×10 IMPLANT
SUT VICRYL 0 UR6 27IN ABS (SUTURE) ×2 IMPLANT
SUT VICRYL RAPIDE 4/0 PS 2 (SUTURE) ×5 IMPLANT
SUT VLOC 180 0 9IN  GS21 (SUTURE) ×2
SUT VLOC 180 0 9IN GS21 (SUTURE) ×2 IMPLANT
SYR 50ML LL SCALE MARK (SYRINGE) ×4 IMPLANT
TIP RUMI ORANGE 6.7MMX12CM (TIP) IMPLANT
TIP UTERINE 5.1X6CM LAV DISP (MISCELLANEOUS) IMPLANT
TIP UTERINE 6.7X10CM GRN DISP (MISCELLANEOUS) IMPLANT
TIP UTERINE 6.7X6CM WHT DISP (MISCELLANEOUS) IMPLANT
TIP UTERINE 6.7X8CM BLUE DISP (MISCELLANEOUS) ×1 IMPLANT
TOWEL OR 17X24 6PK STRL BLUE (TOWEL DISPOSABLE) ×6 IMPLANT
TROCAR 12M 150ML BLUNT (TROCAR) ×2 IMPLANT
TROCAR DISP BLADELESS 8 DVNC (TROCAR) ×1 IMPLANT
TROCAR DISP BLADELESS 8MM (TROCAR) ×1
TROCAR PORT AIRSEAL 8X120 (TROCAR) ×1 IMPLANT
WATER STERILE IRR 1000ML POUR (IV SOLUTION) ×4 IMPLANT

## 2015-09-13 NOTE — Transfer of Care (Signed)
Immediate Anesthesia Transfer of Care Note  Patient: Donna Ibarra  Procedure(s) Performed: Procedure(s): ROBOTIC ASSISTED TOTAL HYSTERECTOMY WITH SALPINGECTOMY (Bilateral)  Patient Location: PACU  Anesthesia Type:General  Level of Consciousness: awake, alert  and oriented  Airway & Oxygen Therapy: Patient Spontanous Breathing and Patient connected to nasal cannula oxygen  Post-op Assessment: Report given to RN and Post -op Vital signs reviewed and stable  Post vital signs: Reviewed and stable  Last Vitals:  Filed Vitals:   09/13/15 0914  BP: 136/81  Pulse: 63  Temp: 36.8 C  Resp: 20    Complications: No apparent anesthesia complications

## 2015-09-13 NOTE — Op Note (Addendum)
09/13/2015  2:19 PM  PATIENT:  Donna Ibarra  50 y.o. female  PRE-OPERATIVE DIAGNOSIS:   Fibroids Menorrhagia Anemia  POST-OPERATIVE DIAGNOSIS:  Fibroids Menorrhagia Anemia  PROCEDURE:  Procedure(s): ROBOTIC ASSISTED TOTAL HYSTERECTOMY WITH SALPINGECTOMY (Bilateral)  SURGEON:  Surgeon(s) and Role:    * Christophe Louis, MD - Primary    * Janyth Pupa, DO - Assisting  PHYSICIAN ASSISTANT:   ASSISTANTS: Dr. Janyth Pupa assisted due to the complexity of the surgery and concern for possible adhesive disease    ANESTHESIA:   general  EBL:  Total I/O In: 2000 [I.V.:2000] Out: 550 [Urine:350; Blood:200]  BLOOD ADMINISTERED:none  DRAINS: Urinary Catheter (Foley)   LOCAL MEDICATIONS USED:  OTHER ropivicaine  SPECIMEN:  Source of Specimen:  uterus / cervix  and bilateral fallopian tubes   DISPOSITION OF SPECIMEN:  PATHOLOGY  COUNTS:  YES  TOURNIQUET:  * No tourniquets in log *  DICTATION: .Dragon Dictation  PLAN OF CARE: Admit for overnight observation  PATIENT DISPOSITION:  PACU - hemodynamically stable.   Delay start of Pharmacological VTE agent (>24hrs) due to surgical blood loss or risk of bleeding: not applicable   Findings. Large uterus containing multiple fibroids.   Procedure: The  patient was taken to the operating room where she was placed under general anesthesia. She was placed in dorsal lithotomy position and prepped and draped in the usual sterile fashion. A weighted speculum was placed into the vagina.  A Deaver was placed anteriorly for retraction. The anterior lip of the cervix was grasped with a single-tooth tenaculum. The vaginal mucosa was injected with 2.5 cc of ropivacaine at the 2/4/ 8 and 10 oclock  positions. The uterus was sounded to 8 cm the cervix was dilated to 6 mm . 0 vicryl sutrure  placed at the 12 and 6:00 positions  Of the cervix to facilitate placement of a Rumi  uterine  manipulator. The manipulator was placed without difficulty. Weighted  speculum and Deaver were removed .  Attention was turned to the patient's abdomen where a  12 mm skin incision was made 4 cm above the umbilicus..  A 12 mm trocar was placed under direct visualization . The pneumoperitoneum  was achieved with CO2 gas.  The laparoscope was removed. 60 cc of ropivacaine were injected into the abdominal cavity. The laparoscope  was reinserted. An  76mm incision was made in the right upper quadrant and an 8 mm   trocar was placed 15 centimeters from the umbilicus.later connected to robotic arm #1). An 8MM incision was made in there  left upper quadrant TROCAR WAS PLACED 16 cm from the umbilicus. Later connected to robotic arm #2.  Attention was turned to the left upper quadrant where a 8 mm midclavicular assistant  trocar was placed. ( All incision sites were injected with 10cc of ropivicaine prior to port placement. )  Once all ports had been placed under direct visualization.The laparoscope was removed and the [employer] robotic system was then right-side docked.  The robotic  arms were connected to the corresponding trocars as listed above. The laparoscope  was then reinserted.  The PK bipolar cautery was placed into port #2 . The monopolar scissor placed in the port #1. All instruments were directed into the pelvis under direct visualization.  Attention  was turned to the surgeons console.. The left mesosalpinx and left  utero-ovarian ligament were cauterized with PK and excised with scissors. The broad ligament was cauterized with PK incised with scissors. The round  ligament was cauterized with the PK incised with scissors. The anterior leaf of broad ligament was incised along the bladder reflection to the midline.  The right mesosalpinx and right  utero-ovarian ligament was cauterized with PK and excised with scissors. The right broad ligament was cauterized with PK excised scissors. The right round ligament was cauterized with PK and excised with scissors. The   broad  ligament was incised  to the midline. The bladder was filled with sterile water stained with methylene blue.  The bladder was dissected off the lower uterine segments of the cervix via sharp and blunt dissection.  The uterine arteries were skeletonized bilaterally. They were cauterized with PK and transected. The KOH ring  was identified.  The anterior colpotomy was performed followed by the posterior colpotomy.  The uterus was removed through the vagina. Due to the size of the uterus it was bivalved with laparoscopic hook. And morcellation was performed in the vagina.   The pk and scissors were removed and log tip forceps were   placed in the port #1 and the cutting needle driver was placed in to port #2. The vaginal cuff  Was closed with 0 v Lock the pelvis was irrigated. .. Avista was placed along the vaginal cuff to control minor oozing. excellent hemostasis was noted.\. All instuments  removed from the ports.  All ports were removed under direct  Visualization. The  pneumoperitoneum was released.  The fascia of the 12 mm umbilical port was closed with 0 Vicryl .The skin incisions were closed with 4-0 Vicryl and then covered with Dermabond.    Attention was turned to the vagina. Upon inspection of the vagina the patient was noted to have bleeding from the left corner of the vaginal cuff. There was a 3 cm laceration noted that was repaired with 2-0 vicryl. Hemostasis was noted. There was a small perineal laceration 1 cm that was reapproximated with 2-0 vicryl . The vagina was packed with 1/2 inch gauze.    Sponge lap and needle counts were correct x2.  The patient was awakened from anesthesia and taken to the recovery room in stable condition.

## 2015-09-13 NOTE — Anesthesia Preprocedure Evaluation (Signed)
Anesthesia Evaluation  Patient identified by MRN, date of birth, ID band Patient awake    Reviewed: Allergy & Precautions, NPO status , Patient's Chart, lab work & pertinent test results  Airway Mallampati: II  TM Distance: >3 FB Neck ROM: Full    Dental no notable dental hx.    Pulmonary pneumonia,    Pulmonary exam normal breath sounds clear to auscultation       Cardiovascular negative cardio ROS Normal cardiovascular exam+ Valvular Problems/Murmurs  Rhythm:Regular Rate:Normal     Neuro/Psych negative neurological ROS  negative psych ROS   GI/Hepatic negative GI ROS, Neg liver ROS,   Endo/Other  negative endocrine ROS  Renal/GU negative Renal ROS     Musculoskeletal  (+) Arthritis ,   Abdominal   Peds  Hematology negative hematology ROS (+) anemia ,   Anesthesia Other Findings   Reproductive/Obstetrics negative OB ROS                             Anesthesia Physical Anesthesia Plan  ASA: II  Anesthesia Plan: General   Post-op Pain Management:    Induction: Intravenous  Airway Management Planned: Oral ETT  Additional Equipment:   Intra-op Plan:   Post-operative Plan: Extubation in OR  Informed Consent: I have reviewed the patients History and Physical, chart, labs and discussed the procedure including the risks, benefits and alternatives for the proposed anesthesia with the patient or authorized representative who has indicated his/her understanding and acceptance.   Dental advisory given  Plan Discussed with: CRNA  Anesthesia Plan Comments:         Anesthesia Quick Evaluation

## 2015-09-13 NOTE — Progress Notes (Signed)
Assumed care from Ivin Poot , an RN . Patient stable.

## 2015-09-13 NOTE — Anesthesia Procedure Notes (Signed)
Procedure Name: Intubation Date/Time: 09/13/2015 10:08 AM Performed by: Jonna Munro Pre-anesthesia Checklist: Patient identified, Emergency Drugs available, Suction available, Patient being monitored and Timeout performed Patient Re-evaluated:Patient Re-evaluated prior to inductionOxygen Delivery Method: Circle system utilized Preoxygenation: Pre-oxygenation with 100% oxygen Intubation Type: IV induction Ventilation: Mask ventilation without difficulty Laryngoscope Size: Mac and 3 Grade View: Grade II Tube type: Oral Tube size: 7.0 mm Number of attempts: 2 Airway Equipment and Method: Stylet Placement Confirmation: ETT inserted through vocal cords under direct vision,  positive ETCO2,  CO2 detector and breath sounds checked- equal and bilateral Secured at: 22 cm Tube secured with: Tape Dental Injury: Teeth and Oropharynx as per pre-operative assessment  Comments: DL by CRNA, unable to visualize cords, DL by ANMD, + intubation, BBS, +ETCO2, atraumatic intubation.
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
## 2015-09-13 NOTE — OR Nursing (Signed)
Family notified by phone of progress in the OR per Dr. Landry Mellow

## 2015-09-14 ENCOUNTER — Encounter (HOSPITAL_COMMUNITY): Payer: Self-pay | Admitting: Obstetrics and Gynecology

## 2015-09-14 DIAGNOSIS — N920 Excessive and frequent menstruation with regular cycle: Secondary | ICD-10-CM | POA: Diagnosis not present

## 2015-09-14 LAB — CBC
HCT: 29.1 % — ABNORMAL LOW (ref 36.0–46.0)
Hemoglobin: 8.9 g/dL — ABNORMAL LOW (ref 12.0–15.0)
MCH: 18.2 pg — AB (ref 26.0–34.0)
MCHC: 30.6 g/dL (ref 30.0–36.0)
MCV: 59.5 fL — AB (ref 78.0–100.0)
Platelets: 347 10*3/uL (ref 150–400)
RBC: 4.89 MIL/uL (ref 3.87–5.11)
RDW: 25.6 % — ABNORMAL HIGH (ref 11.5–15.5)
WBC: 12.3 10*3/uL — AB (ref 4.0–10.5)

## 2015-09-14 MED ORDER — OXYCODONE-ACETAMINOPHEN 5-325 MG PO TABS: 1.0000 | ORAL_TABLET | ORAL | Status: AC | PRN

## 2015-09-14 MED ORDER — DOCUSATE SODIUM 100 MG PO CAPS: 100.0000 mg | ORAL_CAPSULE | Freq: Two times a day (BID) | ORAL | Status: AC

## 2015-09-14 MED ORDER — IBUPROFEN 800 MG PO TABS: 800.0000 mg | ORAL_TABLET | Freq: Three times a day (TID) | ORAL | Status: AC | PRN

## 2015-09-14 NOTE — Progress Notes (Signed)
Pt is discharged in the care of Mother per ambulatory with [employer]. Skin warm and dry. Discharged instruction with Rx were given to pt. Questions asked and answered. Abdominal lapsites are clean and dry..No equipment needed for home use. Stable.

## 2015-09-14 NOTE — Anesthesia Postprocedure Evaluation (Signed)
Anesthesia Post Note  Patient: Donna Ibarra  Procedure(s) Performed: Procedure(s) (LRB): ROBOTIC ASSISTED TOTAL HYSTERECTOMY WITH SALPINGECTOMY (Bilateral)  Patient location during evaluation: [facility] Anesthesia Type: General Level of consciousness: awake and alert and oriented Pain management: pain level controlled Vital Signs Assessment: post-procedure vital signs reviewed and stable Respiratory status: spontaneous breathing, nonlabored ventilation and respiratory function stable Cardiovascular status: stable Postop Assessment: no headache, no signs of nausea or vomiting and adequate PO intake Anesthetic complications: no    Last Vitals:  Filed Vitals:   09/14/15 0133 09/14/15 0540  BP: 113/69 129/61  Pulse: 57 57  Temp: 36.6 C 36.9 C  Resp: 15 17    Last Pain:  Filed Vitals:   09/14/15 0837  PainSc: 3                  Osman Calzadilla

## 2015-09-14 NOTE — Anesthesia Postprocedure Evaluation (Signed)
Anesthesia Post Note  Patient: Donna Ibarra  Procedure(s) Performed: Procedure(s) (LRB): ROBOTIC ASSISTED TOTAL HYSTERECTOMY WITH SALPINGECTOMY (Bilateral)  Patient location during evaluation: PACU Anesthesia Type: General Level of consciousness: sedated and patient cooperative Pain management: pain level controlled Vital Signs Assessment: post-procedure vital signs reviewed and stable Respiratory status: spontaneous breathing Cardiovascular status: stable Anesthetic complications: no    Last Vitals:  Filed Vitals:   09/14/15 0133 09/14/15 0540  BP: 113/69 129/61  Pulse: 57 57  Temp: 36.6 C 36.9 C  Resp: 15 17    Last Pain:  Filed Vitals:   09/14/15 0837  PainSc: [place]

## 2015-09-14 NOTE — Progress Notes (Signed)
Postoperative Note Day # 1  S:  Patient resting comfortable in bed.  Pain controlled.  Tolerating general diet. No flatus, no BM.  Vaginal packing with scant bleeding.  Ambulating without difficulty.  She denies n/v/f/c, SOB, or CP.  Pt plans on breastfeeding.  O: Temp:  [97.8 F (36.6 C)-98.9 F (37.2 C)] 98.4 F (36.9 C) (04/13 0540) Pulse Rate:  [51-86] 57 (04/13 0540) Resp:  [9-23] 17 (04/13 0540) BP: (113-136)/(60-81) 129/61 mmHg (04/13 0540) SpO2:  [96 %-100 %] 100 % (04/13 0540) Weight:  [81.194 kg (179 lb)] 81.194 kg (179 lb) (04/12 1601)   Gen: A&Ox3, NAD CV: RRR, no MRG Resp: CTAB Abdomen: soft, NT, ND +BS Robotic Incisions: c/d/i with dermabond Ext: No edema, no calf tenderness bilaterally, SCDs in place  Labs:  CBC Latest Ref Rng 09/14/2015 09/01/2015  WBC 4.0 - 10.5 K/uL 12.3(H) 5.7  Hemoglobin 12.0 - 15.0 g/dL 8.9(L) 9.8(L)  Hematocrit 36.0 - 46.0 % 29.1(L) 32.3(L)  Platelets 150 - 400 K/uL 347 323    A/P: Pt is a 50 y.o. G0 s/p robotic hysterectomy, bilateral salpingectomy, POD#1  - Pain well controlled -GU: UOP is adequate, voiding freely -GI: Tolerating general diet, not yet passing flatus -Activity: encouraged sitting up to chair and ambulation as tolerated -Prophylaxis: early ambulation, SCDs while in bed -Labs: stable as above, pt to continue iron daily  Meeting postop milestones appropriately, plan for discharge home today.  Janyth Pupa, DO [phone] (pager) [phone] (office)

## 2015-09-14 NOTE — Discharge Instructions (Signed)
Robotic Hysterectomy, Care After Refer to this sheet in the next few weeks. These instructions provide you with information on caring for yourself after your procedure. Your health care provider may also give you more specific instructions. Your treatment has been planned according to current medical practices, but problems sometimes occur. Call your health care provider if you have any problems or questions after your procedure. WHAT TO EXPECT AFTER THE PROCEDURE After your procedure, it is typical to have the following:  Abdominal pain. You will be given pain medicine to control it.  Sore throat from the breathing tube that was inserted during surgery. HOME CARE INSTRUCTIONS  For pain medicine you may alternate between percocet and motrin as needed for pain.  Percocet may cause constipation, so please take colace twice daily as needed.  Do not take aspirin. It can cause bleeding.  Do not drive when taking pain medicine.  Follow your health care provider's advice regarding diet, exercise, lifting, driving, and general activities.  Resume your usual diet as directed and allowed.  Get plenty of rest and sleep.  Do not douche, use tampons, or have sexual intercourse for at least 6 weeks, or until your health care provider gives you permission.  Change your bandages (dressings) as directed by your health care provider.  Monitor your temperature and notify your health care provider of a fever.  Take showers instead of baths for 2-3 weeks.  Do not drink alcohol until your health care provider gives you permission.  If you develop constipation, you may take a mild laxative with your health care provider's permission. Bran foods may help with constipation problems. Drinking enough fluids to keep your urine clear or pale yellow may help as well.  Try to have someone home with you for 1-2 weeks to help around the house.  Keep all of your follow-up appointments as directed by your health  care provider. SEEK MEDICAL CARE IF:   You have swelling, redness, or increasing pain around your incision sites.  You have pus coming from your incision.  You notice a bad smell coming from your incision.  Your incision breaks open.  You feel dizzy or lightheaded.  You have pain or bleeding when you urinate.  You have persistent diarrhea.  You have persistent nausea and vomiting.  You have abnormal vaginal discharge.  You have a rash.  You have any type of abnormal reaction or develop an allergy to your medicine.  You have poor pain control with your prescribed medicine. SEEK IMMEDIATE MEDICAL CARE IF:   You have a fever.  You have severe abdominal pain.  You have chest pain.  You have shortness of breath.  You faint.  You have pain, swelling, or redness in your leg.  You have heavy vaginal bleeding with blood clots. MAKE SURE YOU:  Understand these instructions.  Will watch your condition.  Will get help right away if you are not doing well or get worse.   This information is not intended to replace advice given to you by your health care provider. Make sure you discuss any questions you have with your health care provider.   Document Released: 05/09/2011 Document Revised: 05/25/2013 Document Reviewed: 12/03/2012 Elsevier Interactive Patient Education [employer].

## 2015-09-14 NOTE — Progress Notes (Signed)
Vag. Packing discontinued this morning. Scant bleeding seen, patient tolerated it well.Marland Kitchen

## 2015-10-19 NOTE — Discharge Summary (Signed)
Physician Discharge Summary  Patient ID: Donna Ibarra MRN: [id number] DOB/AGE: [birth year] 48 y.o.  Admit date: 09/13/2015 Discharge date: 10/19/2015  Admission Diagnoses:Uterine fibroids/ pelvic pain / anemia   Discharge Diagnoses:  SAme  Active Problems:   S/P hysterectomy   Discharged Condition: stable  Hospital Course: pt was admitted for observation after undergoing robotic assisted laparoscopic hysterectomy with bilateral salpingectomy. She did well postoperatively with return of bowel and bladder function. She is discharged on pod #1.   Consults: None  Significant Diagnostic Studies: labs: hgb 09/14/2015 was 8.9  Treatments: surgery: robotic assisted laparoscopic hysterectomy with bilateral salpingectomy.    Disposition: 01-Home or Self Care     Medication List    STOP taking these medications        SPRINTEC 28 0.25-35 MG-MCG tablet  Generic drug:  norgestimate-ethinyl estradiol      TAKE these medications        ALEVE 220 MG tablet  Generic drug:  naproxen sodium  Take 440 mg by mouth daily as needed (for pain).     cetirizine-pseudoephedrine 5-120 MG tablet  Commonly known as:  ZYRTEC-D  Take 1 tablet by mouth daily as needed for allergies.     docusate sodium 100 MG capsule  Commonly known as:  COLACE  Take 1 capsule (100 mg total) by mouth 2 (two) times daily.     Ferrous Gluconate 324 (37.5 Fe) MG Tabs  TAKE 1 TABLET BY MOUTH EVERY DAY     fluticasone 50 MCG/ACT nasal spray  Commonly known as:  FLONASE  Place 1 spray into both nostrils daily.     ibuprofen 800 MG tablet  Commonly known as:  ADVIL,MOTRIN  Take 1 tablet (800 mg total) by mouth every 8 (eight) hours as needed (mild pain).     oxyCODONE-acetaminophen 5-325 MG tablet  Commonly known as:  PERCOCET/ROXICET  Take 1-2 tablets by mouth every 4 (four) hours as needed (moderate to severe pain (when tolerating fluids)).     PROAIR HFA 108 (90 Base) MCG/ACT inhaler  Generic drug:   albuterol  Inhale 1-2 puffs into the lungs every 6 (six) hours as needed for wheezing or shortness of breath.           Follow-up Information    Follow up with Catha Brow., MD In 2 weeks.   Specialty:  Obstetrics and Gynecology   Contact information:   [id number] [place] [address] [phone]       Signed: Catha Brow. 10/19/2015, 9:35 AM
